# Patient Record
Sex: Female | Born: 1955 | Race: White | Hispanic: No | Marital: Married | Smoking: Never smoker
Health system: Southern US, Community
[De-identification: ages and names within clinical notes are randomized; demographics above are authoritative.]

## PROBLEM LIST (undated history)

## (undated) DIAGNOSIS — M25559 Pain in unspecified hip: Secondary | ICD-10-CM

## (undated) DIAGNOSIS — M810 Age-related osteoporosis without current pathological fracture: Secondary | ICD-10-CM

## (undated) DIAGNOSIS — A6920 Lyme disease, unspecified: Secondary | ICD-10-CM

## (undated) DIAGNOSIS — E039 Hypothyroidism, unspecified: Secondary | ICD-10-CM

## (undated) DIAGNOSIS — F419 Anxiety disorder, unspecified: Secondary | ICD-10-CM

## (undated) DIAGNOSIS — R519 Headache, unspecified: Secondary | ICD-10-CM

## (undated) DIAGNOSIS — F32A Depression, unspecified: Secondary | ICD-10-CM

## (undated) DIAGNOSIS — C801 Malignant (primary) neoplasm, unspecified: Secondary | ICD-10-CM

## (undated) DIAGNOSIS — E785 Hyperlipidemia, unspecified: Secondary | ICD-10-CM

## (undated) DIAGNOSIS — N9489 Other specified conditions associated with female genital organs and menstrual cycle: Secondary | ICD-10-CM

## (undated) DIAGNOSIS — F09 Unspecified mental disorder due to known physiological condition: Secondary | ICD-10-CM

## (undated) DIAGNOSIS — M1990 Unspecified osteoarthritis, unspecified site: Secondary | ICD-10-CM

## (undated) DIAGNOSIS — G250 Essential tremor: Secondary | ICD-10-CM

## (undated) DIAGNOSIS — N83291 Other ovarian cyst, right side: Secondary | ICD-10-CM

## (undated) HISTORY — PX: EYE SURGERY: SHX253

## (undated) HISTORY — PX: COLONOSCOPY: SHX174

## (undated) HISTORY — PX: CARDIAC CATHETERIZATION: SHX172

---

## 2008-07-30 ENCOUNTER — Ambulatory Visit: Payer: Self-pay | Admitting: Family Medicine

## 2010-01-08 ENCOUNTER — Ambulatory Visit: Payer: Self-pay

## 2018-09-05 ENCOUNTER — Other Ambulatory Visit: Payer: Self-pay | Admitting: Licensed Practical Nurse

## 2018-09-05 DIAGNOSIS — R1909 Other intra-abdominal and pelvic swelling, mass and lump: Secondary | ICD-10-CM

## 2021-04-29 ENCOUNTER — Other Ambulatory Visit: Payer: Self-pay | Admitting: Internal Medicine

## 2021-04-29 DIAGNOSIS — Z1231 Encounter for screening mammogram for malignant neoplasm of breast: Secondary | ICD-10-CM

## 2021-10-30 ENCOUNTER — Other Ambulatory Visit: Payer: Self-pay | Admitting: Internal Medicine

## 2021-10-30 DIAGNOSIS — Z1231 Encounter for screening mammogram for malignant neoplasm of breast: Secondary | ICD-10-CM

## 2022-01-01 ENCOUNTER — Other Ambulatory Visit: Payer: Self-pay

## 2022-01-01 ENCOUNTER — Ambulatory Visit
Admission: RE | Admit: 2022-01-01 | Discharge: 2022-01-01 | Disposition: A | Discharge status: Home / Self Care | Payer: Medicare Other | Source: Ambulatory Visit | Attending: Internal Medicine | Admitting: Internal Medicine

## 2022-01-01 DIAGNOSIS — Z1231 Encounter for screening mammogram for malignant neoplasm of breast: Secondary | ICD-10-CM | POA: Insufficient documentation

## 2022-01-01 HISTORY — DX: Malignant (primary) neoplasm, unspecified: C80.1

## 2022-01-15 ENCOUNTER — Other Ambulatory Visit: Payer: Self-pay | Admitting: *Deleted

## 2022-01-15 ENCOUNTER — Inpatient Hospital Stay
Admission: RE | Admit: 2022-01-15 | Discharge: 2022-01-15 | Disposition: A | Discharge status: Home / Self Care | Payer: Self-pay | Source: Ambulatory Visit | Attending: *Deleted | Admitting: *Deleted

## 2022-01-15 DIAGNOSIS — Z1231 Encounter for screening mammogram for malignant neoplasm of breast: Secondary | ICD-10-CM

## 2022-09-10 ENCOUNTER — Other Ambulatory Visit: Payer: Self-pay | Admitting: Internal Medicine

## 2022-09-10 DIAGNOSIS — E039 Hypothyroidism, unspecified: Secondary | ICD-10-CM

## 2022-09-10 DIAGNOSIS — N83201 Unspecified ovarian cyst, right side: Secondary | ICD-10-CM

## 2022-09-15 ENCOUNTER — Ambulatory Visit
Admission: RE | Admit: 2022-09-15 | Discharge: 2022-09-15 | Disposition: A | Discharge status: Home / Self Care | Payer: Medicare Other | Source: Ambulatory Visit | Attending: Internal Medicine | Admitting: Internal Medicine

## 2022-09-15 ENCOUNTER — Other Ambulatory Visit: Payer: Self-pay | Admitting: Internal Medicine

## 2022-09-15 DIAGNOSIS — N83201 Unspecified ovarian cyst, right side: Secondary | ICD-10-CM

## 2022-09-15 DIAGNOSIS — E039 Hypothyroidism, unspecified: Secondary | ICD-10-CM

## 2022-11-16 ENCOUNTER — Other Ambulatory Visit: Payer: Self-pay | Admitting: Obstetrics and Gynecology

## 2022-12-03 ENCOUNTER — Encounter
Admission: RE | Admit: 2022-12-03 | Discharge: 2022-12-03 | Disposition: A | Discharge status: Home / Self Care | Payer: Medicare Other | Source: Ambulatory Visit | Attending: Obstetrics and Gynecology | Admitting: Obstetrics and Gynecology

## 2022-12-03 ENCOUNTER — Other Ambulatory Visit: Payer: Self-pay

## 2022-12-03 DIAGNOSIS — Z01812 Encounter for preprocedural laboratory examination: Secondary | ICD-10-CM

## 2022-12-03 HISTORY — DX: Hyperlipidemia, unspecified: E78.5

## 2022-12-03 HISTORY — DX: Unspecified mental disorder due to known physiological condition: F09

## 2022-12-03 HISTORY — DX: Hypothyroidism, unspecified: E03.9

## 2022-12-03 HISTORY — DX: Age-related osteoporosis without current pathological fracture: M81.0

## 2022-12-03 HISTORY — DX: Depression, unspecified: F32.A

## 2022-12-03 HISTORY — DX: Pain in unspecified hip: M25.559

## 2022-12-03 HISTORY — DX: Headache, unspecified: R51.9

## 2022-12-03 HISTORY — DX: Unspecified osteoarthritis, unspecified site: M19.90

## 2022-12-03 HISTORY — DX: Anxiety disorder, unspecified: F41.9

## 2022-12-03 HISTORY — DX: Essential tremor: G25.0

## 2022-12-03 HISTORY — DX: Other ovarian cyst, right side: N83.291

## 2022-12-03 HISTORY — DX: Other specified conditions associated with female genital organs and menstrual cycle: N94.89

## 2022-12-03 HISTORY — DX: Lyme disease, unspecified: A69.20

## 2022-12-03 NOTE — Patient Instructions (Addendum)
Your procedure is scheduled on: 12/11/22 - Friday Report to the Registration Desk on the 1st floor of the [facility]. To find out your arrival time, please call [phone] between 1PM - 3PM on: 12/10/22 - Thursday If your arrival time is 6:00 am, do not arrive prior to that time as the [facility] entrance doors do not open until 6:00 am.  REMEMBER: Instructions that are not followed completely may result in serious medical risk, up to and including death; or upon the discretion of your surgeon and anesthesiologist your surgery may need to be rescheduled.  Do not eat food or drink any liquids after midnight the night before surgery.  No gum chewing, lozengers or hard candies.  You may however, drink CLEAR liquids up to 2 hours before you are scheduled to arrive for your surgery. Do not drink anything within 2 hours of your scheduled arrival time.  Clear liquids include: - water  - apple juice without pulp - gatorade (not RED colors) - black coffee or tea (Do NOT add milk or creamers to the coffee or tea) Do NOT drink anything that is not on this list.  TAKE THESE MEDICATIONS THE MORNING OF SURGERY WITH A SIP OF WATER: - levothyroxine (SYNTHROID)  - iothyronine (CYTOMEL   One week prior to surgery: Stop Anti-inflammatories (NSAIDS) such as Advil, Aleve, Ibuprofen, Motrin, Naproxen, Naprosyn and Aspirin based products such as Excedrin, Goodys Powder, BC Powder.  Stop ANY OVER THE COUNTER supplements until after surgery.  You may take Tylenol if needed for pain up until the day of surgery.  No Alcohol for 24 hours before or after surgery.  No Smoking including e-cigarettes for 24 hours prior to surgery.  No chewable tobacco products for at least 6 hours prior to surgery.  No nicotine patches on the day of surgery.  Do not use any "recreational" drugs for at least a week prior to your surgery.  Please be advised that the combination of cocaine and anesthesia may have negative  outcomes, up to and including death. If you test positive for cocaine, your surgery will be cancelled.  On the morning of surgery brush your teeth with toothpaste and water, you may rinse your mouth with mouthwash if you wish. Do not swallow any toothpaste or mouthwash.  Use CHG Soap or wipes as directed on instruction sheet.  Do not wear jewelry, make-up, hairpins, clips or nail polish.  Do not wear lotions, powders, or perfumes.   Do not shave body from the neck down 48 hours prior to surgery just in case you cut yourself which could leave a site for infection.  Also, freshly shaved skin may become irritated if using the CHG soap.  Contact lenses, hearing aids and dentures may not be worn into surgery.  Do not bring valuables to the hospital. [facility] is not responsible for any missing/lost belongings or valuables.   Notify your doctor if there is any change in your medical condition (cold, fever, infection).  Wear comfortable clothing (specific to your surgery type) to the hospital.  After surgery, you can help prevent lung complications by doing breathing exercises.  Take deep breaths and cough every 1-2 hours. Your doctor may order a device called an Incentive Spirometer to help you take deep breaths. When coughing or sneezing, hold a pillow firmly against your incision with both hands. This is called "splinting." Doing this helps protect your incision. It also decreases belly discomfort.  If you are being admitted to the  hospital overnight, leave your suitcase in the car. After surgery it may be brought to your room.  If you are being discharged the day of surgery, you will not be allowed to drive home. You will need a responsible adult (18 years or older) to drive you home and stay with you that night.   If you are taking public transportation, you will need to have a responsible adult (18 years or older) with you. Please confirm with your physician that it is acceptable  to use public transportation.   Please call the [facility]. at [phone] if you have any questions about these instructions.  Surgery Visitation Policy:  Patients undergoing a surgery or procedure may have two family members or support persons with them as long as the person is not COVID-19 positive or experiencing its symptoms.   Inpatient Visitation:    Visiting hours are 7 a.m. to 8 p.m. Up to four visitors are allowed at one time in a patient room. The visitors may rotate out with other people during the day. One designated support person (adult) may remain overnight.  Due to an increase in RSV and influenza rates and associated hospitalizations, children ages 62 and under will not be able to visit patients in [facility]. Masks continue to be strongly recommended.

## 2022-12-04 ENCOUNTER — Encounter: Payer: Self-pay | Admitting: Urgent Care

## 2022-12-04 ENCOUNTER — Encounter
Admission: RE | Admit: 2022-12-04 | Discharge: 2022-12-04 | Disposition: A | Discharge status: Home / Self Care | Payer: Medicare Other | Source: Ambulatory Visit | Attending: Obstetrics and Gynecology | Admitting: Obstetrics and Gynecology

## 2022-12-04 DIAGNOSIS — Z01812 Encounter for preprocedural laboratory examination: Secondary | ICD-10-CM

## 2022-12-04 DIAGNOSIS — R350 Frequency of micturition: Secondary | ICD-10-CM | POA: Insufficient documentation

## 2022-12-04 DIAGNOSIS — I517 Cardiomegaly: Secondary | ICD-10-CM | POA: Insufficient documentation

## 2022-12-04 DIAGNOSIS — I498 Other specified cardiac arrhythmias: Secondary | ICD-10-CM | POA: Diagnosis not present

## 2022-12-04 DIAGNOSIS — Z01818 Encounter for other preprocedural examination: Secondary | ICD-10-CM | POA: Diagnosis present

## 2022-12-04 LAB — BASIC METABOLIC PANEL
Anion gap: 8 (ref 5–15)
BUN: 14 mg/dL (ref 8–23)
CO2: 26 mmol/L (ref 22–32)
Calcium: 9.4 mg/dL (ref 8.9–10.3)
Chloride: 105 mmol/L (ref 98–111)
Creatinine, Ser: 0.82 mg/dL (ref 0.44–1.00)
GFR, Estimated: >60 mL/min (ref >60)
Glucose, Bld: 101 mg/dL — ABNORMAL HIGH (ref 70–99)
Potassium: 3.5 mmol/L (ref 3.5–5.1)
Sodium: 139 mmol/L (ref 135–145)

## 2022-12-04 LAB — CBC
HCT: 42.2 % (ref 36.0–46.0)
Hemoglobin: 14.1 g/dL (ref 12.0–15.0)
MCH: 32.2 pg (ref 26.0–34.0)
MCHC: 33.4 g/dL (ref 30.0–36.0)
MCV: 96.3 fL (ref 80.0–100.0)
Platelets: 298 10*3/uL (ref 150–400)
RBC: 4.38 MIL/uL (ref 3.87–5.11)
RDW: 12.3 % (ref 11.5–15.5)
WBC: 7.6 10*3/uL (ref 4.0–10.5)
nRBC: 0 % (ref 0.0–0.2)

## 2022-12-04 LAB — URINALYSIS, COMPLETE (UACMP) WITH MICROSCOPIC
Bacteria, UA: NONE SEEN
Bilirubin Urine: NEGATIVE
Glucose, UA: NEGATIVE mg/dL
Hgb urine dipstick: NEGATIVE
Ketones, ur: NEGATIVE mg/dL
Leukocytes,Ua: NEGATIVE
Nitrite: NEGATIVE
Protein, ur: NEGATIVE mg/dL
Specific Gravity, Urine: 1.002 — ABNORMAL LOW (ref 1.005–1.030)
Squamous Epithelial / HPF: NONE SEEN (ref 0–5)
WBC, UA: NONE SEEN WBC/hpf (ref 0–5)
pH: 5 (ref 5.0–8.0)

## 2022-12-04 LAB — TYPE AND SCREEN
ABO/RH(D): O POS
Antibody Screen: NEGATIVE

## 2022-12-07 NOTE — H&P (Signed)
Jacqueline Conway is a 66 y.o. female here for Ovarian Cyst (Ref Dr. Tressia Miners - cyst of ovary, u/s done 08/2022)   Referring provider: Gladstone Lighter, MD   History of Present Illness: Patient new to me, referred for right adnexal mass. TVUS done 09/15/22 for RO cyst previously measured 5.3 cm in 2019 and now 7.3 cm in diameter and solid appearing.     TVUS 08/2022  Uterus Measurements: 6.0 x 3.0 x 3.9 cm = volume: 37 mL. Suboptimally  visualized due to body habitus and inadequate bladder distension. No  gross mass.  Endometrium Thickness: 1 mm.  No endometrial fluid or mass  Right ovary: No normal appearing RIGHT ovary visualized, see below  Left ovary Not visualized, likely obscured by bowel   Other findings: No free pelvic fluid. Visualized urinary bladder  unremarkable. Complex heterogeneous hypoechoic mass in RIGHT adnexa measuring 7.3 x 5.2 x 6.7 cm (volume = 130 cm^3), previously measured 5.3 x 3.2 x 5.0 cm (volume = 44 cm^3). Lesion appears solid and increased in size since the prior study. No definite calcifications, cystic components or evidence of hyperechoic foci to suggest fat.   IMPRESSION:  Unremarkable uterus and endometrial complex with nonvisualization of  ovaries.   7.3 cm diameter solid-appearing RIGHT adnexal mass increased in size since 2019 when it measured 5.4 cm greatest size.   Differential diagnosis includes endometrioma, exophytic uterine  leiomyoma, fibrothecoma, or other neoplasm; due to significant  increase in size since previous exam, surgical evaluation  recommended. If further preoperative characterization is required,  may consider MR imaging with and without contrast.    Component     Latest Ref Rng 09/07/2022 Cancer Antigen (CA) 125 - [facility]     0.0 - 38.1 U/mL 18.7      Today: Patient has had a cyst since 2019, they did bloodwork and ultrasounds to follow this. She was given the option of expectant and surgical management. She changed doctors  and had a gap in imaging until 08/2022.    She can "feel it". Not all the time, but sitting in exam room today, she can feel the cyst in her pelvis. She is also concerned about the risk of torsion.    Pertinent Hx: - SVD x 3  - Last pap 08/2012 neg/neg    - No Fhx of breast, colon or ovarian cancer    - Hx of lyme disease (was bedridden for 4 years and limited mobility following this for many years.)  - Osteoporosis    Past Medical History:  has a past medical history of Anxiety, Cataracts, bilateral, Depression, Essential tremor, Hip joint pain, Hyperlipidemia, Hypothyroidism, Lyme disease, Osteoporosis, and Psychological trauma (about 17 years ago).  Past Surgical History:  has a past surgical history that includes extraction cataract extracapsular w/insertion intraocular prosthesis (Left, 09/21/2022); insj rx eluting implt punctal dilat lac canal ea  (Left, 09/21/2022); extraction cataract extracapsular w/insertion intraocular prosthesis (Right, 10/01/2022); and insj rx eluting implt punctal dilat lac canal ea  (Right, 10/01/2022). Family History: family history includes High blood pressure (Hypertension) in her mother; Lung cancer in her mother; Mental illness in her mother; Stroke in her father. Social History:  reports that she has never smoked. She has never used smokeless tobacco. She reports current alcohol use. She reports that she does not use drugs. OB/GYN History:  OB History       Gravida 3   Para 3   Term 3   Preterm     AB  Living 3       SAB     IAB     Ectopic     Molar     Multiple     Live Births          Allergies: is allergic to penicillins, rifamycin analogues, ampicillin, and rifabutin. Medications: Current Outpatient Medications:    ASHWAGANDHA ROOT EXTRACT ORAL, Take 1 capsule by mouth once daily as needed, Disp: , Rfl:    levothyroxine (SYNTHROID) 75 MCG tablet, TAKE 1 TABLET BY MOUTH EVERY DAY ON EMPTY STOMACH 30-60 MINS  BEFORE BREAKFAST, Disp: 90 tablet, Rfl: 1   liothyronine (CYTOMEL) 25 MCG tablet, Take 0.5 tablets (12.5 mcg total) by mouth once daily Take on an empty stomach with a glass of water at least 30-60 minutes before breakfast. (Patient taking differently: Take 12.5 mcg by mouth every morning Take on an empty stomach with a glass of water at least 30-60 minutes before breakfast.), Disp: 45 tablet, Rfl: 1   MAGNESIUM ORAL, Take 1 tablet by mouth every morning Name: Magnesium Threonate, Disp: , Rfl:    thiamine (VITAMIN B-1) 100 MG tablet, Take 100 mg by mouth as needed, Disp: , Rfl:    vitamin B complex (B COMPLEX 1 ORAL), Take by mouth as needed, Disp: , Rfl:    VITAMIN D3 ORAL, Take 2,000 Units by mouth every morning, Disp: , Rfl:    VITAMIN K2 ORAL, Take 1 capsule by mouth 2 (two) times daily as needed, Disp: , Rfl:    ZINC ORAL, Take 1 tablet by mouth every morning, Disp: , Rfl:    Review of Systems: No SOB, no palpitations or chest pain, no new lower extremity edema, no nausea or vomiting or bowel or bladder complaints. See HPI for gyn specific ROS.    Exam:   BP (!) 140/93   Pulse 101   Ht 167.6 cm ('5\' 6"'$ )   Wt 74.4 kg (164 lb)   BMI 26.47 kg/m     Constitutional:  General appearance: Well nourished, well developed female in no acute distress.  Neuro/psych:  Normal mood and affect. No gross motor deficits. Neck:  Supple, normal appearance.  Respiratory:  Normal respiratory effort, no use of accessory muscles Skin:  No visible rashes or external lesions     Pelvic: deferred    Impression:   The encounter diagnosis was Adnexal mass.   Plan:   - Adnexal Mass, complex, postmenopausal - Discussed with pt her ultrasound results, images not available to me yet, and reviewed her lab results. CA125 wnl per her report in 2019, and normal at 18 - We reviewed that because of the increase in size and solid components described in her cyst, I recommended surgical excision. We discussed  possibility of malignancy, ovarian torsion, dermoid, endometrioma.  - All of her questions answered. - Would plan for bilateral salping oophorectomy with pelvic washings, removing the cyst intact. We reviewed surgical risks and recovery time as well as risk reduction with BSO. Use the robot assist and consider posterior cul de sac

## 2022-12-10 MED ORDER — LACTATED RINGERS IV SOLN: INTRAVENOUS | Status: DC

## 2022-12-10 MED ORDER — ACETAMINOPHEN 500 MG PO TABS: 1000.0000 mg | ORAL_TABLET | ORAL | Status: AC

## 2022-12-10 MED ORDER — FAMOTIDINE 20 MG PO TABS: 20.0000 mg | ORAL_TABLET | Freq: Once | ORAL | Status: AC

## 2022-12-10 MED ORDER — ORAL CARE MOUTH RINSE: 15.0000 mL | Freq: Once | OROMUCOSAL | Status: AC

## 2022-12-10 MED ORDER — GABAPENTIN 300 MG PO CAPS: 300.0000 mg | ORAL_CAPSULE | ORAL | Status: AC

## 2022-12-10 MED ORDER — POVIDONE-IODINE 10 % EX SWAB: 2.0000 | Freq: Once | CUTANEOUS | Status: DC

## 2022-12-10 MED ORDER — CHLORHEXIDINE GLUCONATE 0.12 % MT SOLN: 15.0000 mL | Freq: Once | OROMUCOSAL | Status: AC

## 2022-12-11 ENCOUNTER — Encounter
Admission: RE | Disposition: A | Discharge status: Home / Self Care | Payer: Self-pay | Source: Home / Self Care | Attending: Obstetrics and Gynecology

## 2022-12-11 ENCOUNTER — Encounter: Payer: Self-pay | Admitting: Obstetrics and Gynecology

## 2022-12-11 ENCOUNTER — Other Ambulatory Visit: Payer: Self-pay

## 2022-12-11 ENCOUNTER — Ambulatory Visit: Payer: Medicare Other | Admitting: Anesthesiology

## 2022-12-11 ENCOUNTER — Ambulatory Visit
Admission: RE | Admit: 2022-12-11 | Discharge: 2022-12-11 | Disposition: A | Discharge status: Home / Self Care | Payer: Medicare Other | Attending: Obstetrics and Gynecology | Admitting: Obstetrics and Gynecology

## 2022-12-11 DIAGNOSIS — D270 Benign neoplasm of right ovary: Secondary | ICD-10-CM | POA: Insufficient documentation

## 2022-12-11 DIAGNOSIS — E039 Hypothyroidism, unspecified: Secondary | ICD-10-CM | POA: Insufficient documentation

## 2022-12-11 DIAGNOSIS — N959 Unspecified menopausal and perimenopausal disorder: Secondary | ICD-10-CM | POA: Diagnosis not present

## 2022-12-11 DIAGNOSIS — E785 Hyperlipidemia, unspecified: Secondary | ICD-10-CM | POA: Insufficient documentation

## 2022-12-11 DIAGNOSIS — N839 Noninflammatory disorder of ovary, fallopian tube and broad ligament, unspecified: Secondary | ICD-10-CM | POA: Diagnosis present

## 2022-12-11 DIAGNOSIS — N838 Other noninflammatory disorders of ovary, fallopian tube and broad ligament: Secondary | ICD-10-CM | POA: Insufficient documentation

## 2022-12-11 DIAGNOSIS — G250 Essential tremor: Secondary | ICD-10-CM | POA: Insufficient documentation

## 2022-12-11 DIAGNOSIS — Z01818 Encounter for other preprocedural examination: Secondary | ICD-10-CM

## 2022-12-11 HISTORY — PX: ROBOTIC ASSISTED BILATERAL SALPINGO OOPHERECTOMY: SHX6078

## 2022-12-11 LAB — BASIC METABOLIC PANEL
Anion gap: 9 (ref 5–15)
BUN: 9 mg/dL (ref 8–23)
CO2: 23 mmol/L (ref 22–32)
Calcium: 9 mg/dL (ref 8.9–10.3)
Chloride: 108 mmol/L (ref 98–111)
Creatinine, Ser: 0.55 mg/dL (ref 0.44–1.00)
GFR, Estimated: >60 mL/min (ref >60)
Glucose, Bld: 114 mg/dL — ABNORMAL HIGH (ref 70–99)
Potassium: 3.7 mmol/L (ref 3.5–5.1)
Sodium: 140 mmol/L (ref 135–145)

## 2022-12-11 LAB — ABO/RH: ABO/RH(D): O POS

## 2022-12-11 LAB — CBC
HCT: 41.5 % (ref 36.0–46.0)
Hemoglobin: 13.8 g/dL (ref 12.0–15.0)
MCH: 32.1 pg (ref 26.0–34.0)
MCHC: 33.3 g/dL (ref 30.0–36.0)
MCV: 96.5 fL (ref 80.0–100.0)
Platelets: 263 10*3/uL (ref 150–400)
RBC: 4.3 MIL/uL (ref 3.87–5.11)
RDW: 12.5 % (ref 11.5–15.5)
WBC: 6.9 10*3/uL (ref 4.0–10.5)
nRBC: 0 % (ref 0.0–0.2)

## 2022-12-11 SURGERY — SALPINGO-OOPHORECTOMY, BILATERAL, ROBOT-ASSISTED
Anesthesia: General | Site: Vagina

## 2022-12-11 MED ORDER — ONDANSETRON HCL 4 MG/2ML IJ SOLN
INTRAMUSCULAR | Status: DC | PRN
  Administered 2022-12-11: 4 mg via INTRAVENOUS

## 2022-12-11 MED ORDER — SODIUM CHLORIDE 0.9 % IV SOLN: INTRAVENOUS | Status: DC | PRN

## 2022-12-11 MED ORDER — GABAPENTIN 300 MG PO CAPS
ORAL_CAPSULE | ORAL | Status: AC
  Administered 2022-12-11: 300 mg via ORAL
  Filled 2022-12-11: qty 1

## 2022-12-11 MED ORDER — IBUPROFEN 800 MG PO TABS: 800.0000 mg | ORAL_TABLET | Freq: Three times a day (TID) | ORAL | 1 refills | Status: AC

## 2022-12-11 MED ORDER — SODIUM CHLORIDE 0.9 % IR SOLN
Status: DC | PRN
  Administered 2022-12-11: 200 mL

## 2022-12-11 MED ORDER — MIDAZOLAM HCL 2 MG/2ML IJ SOLN
INTRAMUSCULAR | Status: DC | PRN
  Administered 2022-12-11: 2 mg via INTRAVENOUS

## 2022-12-11 MED ORDER — FENTANYL CITRATE (PF) 100 MCG/2ML IJ SOLN: 25.0000 ug | INTRAMUSCULAR | Status: DC | PRN

## 2022-12-11 MED ORDER — DEXAMETHASONE SODIUM PHOSPHATE 10 MG/ML IJ SOLN
INTRAMUSCULAR | Status: AC
  Filled 2022-12-11: qty 1

## 2022-12-11 MED ORDER — ROCURONIUM BROMIDE 10 MG/ML (PF) SYRINGE
PREFILLED_SYRINGE | INTRAVENOUS | Status: AC
  Filled 2022-12-11: qty 10

## 2022-12-11 MED ORDER — SILVER NITRATE-POT NITRATE 75-25 % EX MISC
CUTANEOUS | Status: AC
  Filled 2022-12-11: qty 10

## 2022-12-11 MED ORDER — OXYCODONE HCL 5 MG PO TABS: 5.0000 mg | ORAL_TABLET | ORAL | 0 refills | Status: AC | PRN

## 2022-12-11 MED ORDER — FAMOTIDINE 20 MG PO TABS
ORAL_TABLET | ORAL | Status: AC
  Administered 2022-12-11: 20 mg via ORAL
  Filled 2022-12-11: qty 1

## 2022-12-11 MED ORDER — PROPOFOL 1000 MG/100ML IV EMUL
INTRAVENOUS | Status: AC
  Filled 2022-12-11: qty 200

## 2022-12-11 MED ORDER — DOCUSATE SODIUM 100 MG PO CAPS: 100.0000 mg | ORAL_CAPSULE | Freq: Two times a day (BID) | ORAL | 0 refills | Status: AC

## 2022-12-11 MED ORDER — PROPOFOL 10 MG/ML IV BOLUS
INTRAVENOUS | Status: DC | PRN
  Administered 2022-12-11: 250 mg via INTRAVENOUS

## 2022-12-11 MED ORDER — DEXAMETHASONE SODIUM PHOSPHATE 10 MG/ML IJ SOLN
INTRAMUSCULAR | Status: DC | PRN
  Administered 2022-12-11: 10 mg via INTRAVENOUS

## 2022-12-11 MED ORDER — CHLORHEXIDINE GLUCONATE 0.12 % MT SOLN
OROMUCOSAL | Status: AC
  Administered 2022-12-11: 15 mL via OROMUCOSAL
  Filled 2022-12-11: qty 15

## 2022-12-11 MED ORDER — BUPIVACAINE HCL (PF) 0.5 % IJ SOLN
INTRAMUSCULAR | Status: AC
  Filled 2022-12-11: qty 30

## 2022-12-11 MED ORDER — FENTANYL CITRATE (PF) 100 MCG/2ML IJ SOLN
INTRAMUSCULAR | Status: AC
  Filled 2022-12-11: qty 2

## 2022-12-11 MED ORDER — ONDANSETRON HCL 4 MG/2ML IJ SOLN
INTRAMUSCULAR | Status: AC
  Filled 2022-12-11: qty 2

## 2022-12-11 MED ORDER — ACETAMINOPHEN 10 MG/ML IV SOLN
INTRAVENOUS | Status: AC
  Filled 2022-12-11: qty 100

## 2022-12-11 MED ORDER — LIDOCAINE HCL (PF) 2 % IJ SOLN
INTRAMUSCULAR | Status: AC
  Filled 2022-12-11: qty 5

## 2022-12-11 MED ORDER — ACETAMINOPHEN 500 MG PO TABS
ORAL_TABLET | ORAL | Status: AC
  Administered 2022-12-11: 1000 mg via ORAL
  Filled 2022-12-11: qty 2

## 2022-12-11 MED ORDER — ONDANSETRON HCL 4 MG/2ML IJ SOLN: 4.0000 mg | Freq: Once | INTRAMUSCULAR | Status: DC | PRN

## 2022-12-11 MED ORDER — LIDOCAINE HCL (CARDIAC) PF 100 MG/5ML IV SOSY
PREFILLED_SYRINGE | INTRAVENOUS | Status: DC | PRN
  Administered 2022-12-11: 100 mg via INTRAVENOUS

## 2022-12-11 MED ORDER — SUGAMMADEX SODIUM 200 MG/2ML IV SOLN
INTRAVENOUS | Status: DC | PRN
  Administered 2022-12-11: 300 mg via INTRAVENOUS

## 2022-12-11 MED ORDER — DEXMEDETOMIDINE HCL IN NACL 80 MCG/20ML IV SOLN
INTRAVENOUS | Status: DC | PRN
  Administered 2022-12-11: 4 ug via BUCCAL

## 2022-12-11 MED ORDER — MIDAZOLAM HCL 2 MG/2ML IJ SOLN
INTRAMUSCULAR | Status: AC
  Filled 2022-12-11: qty 2

## 2022-12-11 MED ORDER — FENTANYL CITRATE (PF) 100 MCG/2ML IJ SOLN
INTRAMUSCULAR | Status: DC | PRN
  Administered 2022-12-11 (×2): 50 ug via INTRAVENOUS

## 2022-12-11 MED ORDER — KETOROLAC TROMETHAMINE 30 MG/ML IJ SOLN
INTRAMUSCULAR | Status: DC | PRN
  Administered 2022-12-11: 30 mg via INTRAVENOUS

## 2022-12-11 MED ORDER — BUPIVACAINE HCL (PF) 0.5 % IJ SOLN
INTRAMUSCULAR | Status: DC | PRN
  Administered 2022-12-11: 30 mL

## 2022-12-11 MED ORDER — KETOROLAC TROMETHAMINE 30 MG/ML IJ SOLN
INTRAMUSCULAR | Status: AC
  Filled 2022-12-11: qty 1

## 2022-12-11 MED ORDER — ROCURONIUM BROMIDE 100 MG/10ML IV SOLN
INTRAVENOUS | Status: DC | PRN
  Administered 2022-12-11: 50 mg via INTRAVENOUS
  Administered 2022-12-11 (×2): 20 mg via INTRAVENOUS

## 2022-12-11 MED ORDER — ACETAMINOPHEN EXTRA STRENGTH 500 MG PO TABS: 1000.0000 mg | ORAL_TABLET | Freq: Four times a day (QID) | ORAL | 0 refills | Status: AC

## 2022-12-11 MED ORDER — EPHEDRINE SULFATE (PRESSORS) 50 MG/ML IJ SOLN
INTRAMUSCULAR | Status: DC | PRN
  Administered 2022-12-11: 10 mg via INTRAVENOUS

## 2022-12-11 MED ORDER — GABAPENTIN 300 MG PO CAPS: 300.0000 mg | ORAL_CAPSULE | Freq: Every day | ORAL | 0 refills | Status: AC

## 2022-12-11 SURGICAL SUPPLY — 77 items
ADH SKN CLS APL DERMABOND .7 (GAUZE/BANDAGES/DRESSINGS) ×2
ANCHOR TIS RET SYS 235ML (MISCELLANEOUS) IMPLANT
APL PRP STRL LF DISP 70% ISPRP (MISCELLANEOUS) ×2
APL SRG 38 LTWT LNG FL B (MISCELLANEOUS)
APPLICATOR ARISTA FLEXITIP XL (MISCELLANEOUS) IMPLANT
BAG DRN RND TRDRP ANRFLXCHMBR (UROLOGICAL SUPPLIES) ×2
BAG LAPAROSCOPIC 12 15 PORT 16 (BASKET) IMPLANT
BAG RETRIEVAL 12/15 (BASKET) ×2
BAG TISS RTRVL C235 10X14 (MISCELLANEOUS) ×2
BAG URINE DRAIN 2000ML AR STRL (UROLOGICAL SUPPLIES) ×2 IMPLANT
BLADE SURG SZ11 CARB STEEL (BLADE) ×2 IMPLANT
CANNULA CAP OBTURATR AIRSEAL 8 (CAP) ×2 IMPLANT
CATH FOLEY 2WAY  5CC 16FR (CATHETERS) ×2
CATH FOLEY 2WAY 5CC 16FR (CATHETERS) ×2
CATH URTH 16FR FL 2W BLN LF (CATHETERS) ×2 IMPLANT
CHLORAPREP W/TINT 26 (MISCELLANEOUS) ×2 IMPLANT
COUNTER NEEDLE 20/40 LG (NEEDLE) ×2 IMPLANT
COVER TIP SHEARS 8 DVNC (MISCELLANEOUS) ×2 IMPLANT
COVER TIP SHEARS 8MM DA VINCI (MISCELLANEOUS) ×2
DERMABOND ADVANCED .7 DNX12 (GAUZE/BANDAGES/DRESSINGS) ×2 IMPLANT
DRAPE 3/4 80X56 (DRAPES) ×4 IMPLANT
DRAPE ARM DVNC X/XI (DISPOSABLE) ×8 IMPLANT
DRAPE COLUMN DVNC XI (DISPOSABLE) ×2 IMPLANT
DRAPE DA VINCI XI ARM (DISPOSABLE) ×8
DRAPE DA VINCI XI COLUMN (DISPOSABLE) ×2
DRAPE ROBOT W/ LEGGING 30X125 (DRAPES) ×2 IMPLANT
ELECT REM PT RETURN 9FT ADLT (ELECTROSURGICAL) ×2
ELECTRODE REM PT RTRN 9FT ADLT (ELECTROSURGICAL) ×2 IMPLANT
GAUZE 4X4 16PLY ~~LOC~~+RFID DBL (SPONGE) ×4 IMPLANT
GLOVE BIO SURGEON STRL SZ7 (GLOVE) ×8 IMPLANT
GLOVE SURG SYN 7.0 (GLOVE) ×2 IMPLANT
GLOVE SURG SYN 7.0 PF PI (GLOVE) ×2 IMPLANT
GLOVE SURG UNDER LTX SZ7.5 (GLOVE) ×8 IMPLANT
GLOVE SURG UNDER POLY LF SZ7.5 (GLOVE) ×2 IMPLANT
GOWN STRL REUS W/ TWL LRG LVL3 (GOWN DISPOSABLE) ×16 IMPLANT
GOWN STRL REUS W/TWL LRG LVL3 (GOWN DISPOSABLE) ×16
GRASPER SUT TROCAR 14GX15 (MISCELLANEOUS) ×2 IMPLANT
HEMOSTAT ARISTA ABSORB 3G PWDR (HEMOSTASIS) IMPLANT
IRRIGATION STRYKERFLOW (MISCELLANEOUS) IMPLANT
IRRIGATOR STRYKERFLOW (MISCELLANEOUS) ×2
IV NS 1000ML (IV SOLUTION)
IV NS 1000ML BAXH (IV SOLUTION) IMPLANT
KIT PINK PAD W/HEAD ARE REST (MISCELLANEOUS) ×2
KIT PINK PAD W/HEAD ARM REST (MISCELLANEOUS) ×2 IMPLANT
KIT TURNOVER CYSTO (KITS) ×2 IMPLANT
LABEL OR SOLS (LABEL) ×2 IMPLANT
MANIFOLD NEPTUNE II (INSTRUMENTS) ×2 IMPLANT
NS IRRIG 1000ML POUR BTL (IV SOLUTION) ×2 IMPLANT
NS IRRIG 500ML POUR BTL (IV SOLUTION) ×2 IMPLANT
OBTURATOR OPTICAL STANDARD 8MM (TROCAR) ×2
OBTURATOR OPTICAL STND 8 DVNC (TROCAR) ×2
OBTURATOR OPTICALSTD 8 DVNC (TROCAR) ×2 IMPLANT
OCCLUDER COLPOPNEUMO (BALLOONS) ×2 IMPLANT
PACK DNC HYST (MISCELLANEOUS) ×2 IMPLANT
PACK GYN LAPAROSCOPIC (MISCELLANEOUS) ×2 IMPLANT
PAD OB MATERNITY 4.3X12.25 (PERSONAL CARE ITEMS) ×2 IMPLANT
PAD PREP 24X41 OB/GYN DISP (PERSONAL CARE ITEMS) ×2 IMPLANT
RETRACTOR RING XSMALL (MISCELLANEOUS) IMPLANT
RTRCTR WOUND ALEXIS 13CM XS SH (MISCELLANEOUS) ×2
SCRUB CHG 4% DYNA-HEX 4OZ (MISCELLANEOUS) ×2 IMPLANT
SEAL CANN UNIV 5-8 DVNC XI (MISCELLANEOUS) ×6 IMPLANT
SEAL XI 5MM-8MM UNIVERSAL (MISCELLANEOUS) ×6
SEALER VESSEL DA VINCI XI (MISCELLANEOUS) ×2
SEALER VESSEL EXT DVNC XI (MISCELLANEOUS) ×2 IMPLANT
SET TUBE FILTERED XL AIRSEAL (SET/KITS/TRAYS/PACK) ×2 IMPLANT
SOL PREP PVP 2OZ (MISCELLANEOUS) ×2
SOLUTION ELECTROLUBE (MISCELLANEOUS) ×2 IMPLANT
SOLUTION PREP PVP 2OZ (MISCELLANEOUS) ×2 IMPLANT
SURGILUBE 2OZ TUBE FLIPTOP (MISCELLANEOUS) ×2 IMPLANT
SUT MNCRL 4-0 (SUTURE) ×2
SUT MNCRL 4-0 27XMFL (SUTURE) ×2
SUT VIC AB 0 CT2 27 (SUTURE) ×4 IMPLANT
SUT VICRYL 0 AB UR-6 (SUTURE) IMPLANT
SUT VLOC 90 2/L VL 12 GS22 (SUTURE) ×2 IMPLANT
SUTURE MNCRL 4-0 27XMF (SUTURE) ×2 IMPLANT
TOWEL OR 17X26 4PK STRL BLUE (TOWEL DISPOSABLE) ×2 IMPLANT
TRAP SPECIMEN MUCUS 40CC (MISCELLANEOUS) IMPLANT

## 2022-12-11 NOTE — Discharge Instructions (Addendum)
Laparoscopic Ovarian Surgery Discharge Instructions  For the next three days, take ibuprofen and acetaminophen on a schedule, every 8 hours. You can take them together or you can intersperse them, and take one every four hours. I also gave you gabapentin for nighttime, to help you sleep and also to control pain. Take gabapentin medicines at night for at least the next 3 nights. You also have a narcotic, oxycodone, to take as needed if the above medicines don't help.  Postop constipation is a major cause of pain. Stay well hydrated, walk as you tolerate, and take over the counter senna as well as stool softeners if you need them.   RISKS AND COMPLICATIONS  Infection. Bleeding. Injury to surrounding organs. Anesthetic side effects.   PROCEDURE  You may be given a medicine to help you relax (sedative) before the procedure. You will be given a medicine to make you sleep (general anesthetic) during the procedure. A tube will be put down your throat to help your breath while under general anesthesia. Several small cuts (incisions) are made in the lower abdominal area and one incision is made near the belly button. Your abdominal area will be inflated with a safe gas (carbon dioxide). This helps give the surgeon room to operate, visualize, and helps the surgeon avoid other organs. A thin, lighted tube (laparoscope) with a camera attached is inserted into your abdomen through the incision near the belly button. Other small instruments may also be inserted through other abdominal incisions. The ovary is located and are removed. After the ovary is removed, the gas is released from the abdomen. The incisions will be closed with stitches (sutures), and Dermabond. A bandage may be placed over the incisions.  AFTER THE PROCEDURE  You will also have some mild abdominal discomfort for 3-7 days. You will be given pain medicine to ease any discomfort. As long as there are no problems, you may be allowed to  go home. Someone will need to drive you home and be with you for at least 24 hours once home. You may have some mild discomfort in the throat. This is from the tube placed in your throat while you were sleeping. You may experience discomfort in the shoulder area from some trapped air between the liver and diaphragm. This sensation is normal and will slowly go away on its own.  HOME CARE INSTRUCTIONS  Take all medicines as directed. Only take over-the-counter or prescription medicines for pain, discomfort, or fever as directed by your caregiver. Resume daily activities as directed. Showers are preferred over baths for 2 weeks. You may resume sexual activities in 1 week or as you feel you would like to. Do not drive while taking narcotics.  SEEK MEDICAL CARE IF: . There is increasing abdominal pain. You feel lightheaded or faint. You have the chills. You have an oral temperature above 102 F (38.9 C). There is pus-like (purulent) drainage from any of the wounds. You are unable to pass gas or have a bowel movement. You feel sick to your stomach (nauseous) or throw up (vomit) and can't control it with your medicines.  MAKE SURE YOU:  Understand these instructions. Will watch your condition. Will get help right away if you are not doing well or get worse.  ExitCare Patient Information 2013 [place].    Laparoscopic Ovarian Surgery Discharge Instructions  For the next three days, take ibuprofen and acetaminophen on a schedule, every 8 hours. You can take them together or you can intersperse them,  and take one every four hours. I also gave you gabapentin for nighttime, to help you sleep and also to control pain. Take gabapentin medicines at night for at least the next 3 nights. You also have a narcotic, oxycodone, to take as needed if the above medicines don't help.  Postop constipation is a major cause of pain. Stay well hydrated, walk as you tolerate, and take over the counter  senna as well as stool softeners if you need them.   RISKS AND COMPLICATIONS  Infection. Bleeding. Injury to surrounding organs. Anesthetic side effects.   PROCEDURE  You may be given a medicine to help you relax (sedative) before the procedure. You will be given a medicine to make you sleep (general anesthetic) during the procedure. A tube will be put down your throat to help your breath while under general anesthesia. Several small cuts (incisions) are made in the lower abdominal area and one incision is made near the belly button. Your abdominal area will be inflated with a safe gas (carbon dioxide). This helps give the surgeon room to operate, visualize, and helps the surgeon avoid other organs. A thin, lighted tube (laparoscope) with a camera attached is inserted into your abdomen through the incision near the belly button. Other small instruments may also be inserted through other abdominal incisions. The ovary is located and are removed. After the ovary is removed, the gas is released from the abdomen. The incisions will be closed with stitches (sutures), and Dermabond. A bandage may be placed over the incisions.  AFTER THE PROCEDURE  You will also have some mild abdominal discomfort for 3-7 days. You will be given pain medicine to ease any discomfort. As long as there are no problems, you may be allowed to go home. Someone will need to drive you home and be with you for at least 24 hours once home. You may have some mild discomfort in the throat. This is from the tube placed in your throat while you were sleeping. You may experience discomfort in the shoulder area from some trapped air between the liver and diaphragm. This sensation is normal and will slowly go away on its own.  HOME CARE INSTRUCTIONS  Take all medicines as directed. Only take over-the-counter or prescription medicines for pain, discomfort, or fever as directed by your caregiver. Resume daily activities as  directed. Showers are preferred over baths for 2 weeks. You may resume sexual activities in 1 week or as you feel you would like to. Do not drive while taking narcotics.  SEEK MEDICAL CARE IF: . There is increasing abdominal pain. You feel lightheaded or faint. You have the chills. You have an oral temperature above 102 F (38.9 C). There is pus-like (purulent) drainage from any of the wounds. You are unable to pass gas or have a bowel movement. You feel sick to your stomach (nauseous) or throw up (vomit) and can't control it with your medicines.  MAKE SURE YOU:  Understand these instructions. Will watch your condition. Will get help right away if you are not doing well or get worse.  ExitCare Patient Information 2013 [place].  Here is a helpful article from the website [employer], regarding constipation  Here are reasons why constipation occurs after surgery: 1) During the operation and in the recovery room, most people are given opioid pain medication, primarily through an IV, to treat moderate or severe pain. Intravenous opioids include morphine, Dilaudid and fentanyl. After surgery, patients are often prescribed opioid pain medication to  take by mouth at home, including codeine, Vicodin, Norco, and Percocet. All of these medications cause constipation by slowing down the movement of your intestine. 2) Changes in your diet before surgery can be another culprit. It is common to get specific instructions to change how you normally eat or drink before your surgery, like only having liquids the day before or not having anything to eat or drink after midnight the night before surgery. For this reason, temporary dehydration may occur. This, along with not eating or only having liquids, means that you are getting less fiber than usual. Both these factors contribute to constipation. 3) Changes in your diet after surgery can also contribute to the problem. Although many people don't  have dietary restrictions after operations, being under anesthesia can make you lose your appetite for several hours and maybe even days. Some people can even have nausea or vomiting. Not eating or drinking normally means that you are not getting enough fiber and you can get dehydrated, both leading to constipation. 4) Lying in a bed more than usual--which happens before, during and after surgery--combined with the medications and diet changes, all work together to slow down your colon and make your poop turn to rock.  No one likes to be constipated.  Let's face it, it's not a pleasant feeling when you don't poop for days, then strain on the toilet to finally pass something large enough to cause damage. An ounce of prevention is worth a pound of cure, so: Assume you will be constipated. Plan and prepare accordingly. Post-surgery is one of those unique situations where the temporary use of laxatives can make a world of difference. Always consult with your doctor, and recognize that if you wait several days after surgery to take a laxative, the constipation might be too severe for these over-the-counter options. It is always important to discuss all medications you plan on taking with your doctor. Ask your doctor if you can start the laxative immediately after surgery. *  Here are go-to post-surgery laxatives: Senna: Senna is an herb that acts as a "stimulant laxative," meaning it increases the activity of the intestine to cause you to have a bowel movement. It comes in many forms, but senna pills are easy to take and are sold over the counter at almost all pharmacies. Since opioid pain medications slow down the activity of the intestine, it makes sense to take a medication to help reverse that side effect. Long-term use of a stimulant laxative is not a good idea since it can make your colon "lazy" and not function properly; however, temporary use immediately after surgery is acceptable. In general, if you  are able to eat a normal diet, taking senna soon after surgery works the best. Senna usually works within hours to produce a bowel movement, but this is less predictable when you are taking different medications after surgery. Try not to wait several days to start taking senna, as often it is too late by then. Just like with all medications or supplements, check with your doctor before starting new treatment.   Magnesium: Magnesium is an important mineral that our body needs. We get magnesium from some foods that we eat, especially foods that are high in fiber such as broccoli, almonds and whole grains. There are also magnesium-based medications used to treat constipation including milk of magnesia (magnesium hydroxide), magnesium citrate and magnesium oxide. They work by drawing water into the intestine, putting it into the class of "osmotic" laxatives. Magnesium products  in low doses appear to be safe, but if taken in very large doses, can lead to problems such as irregular heartbeat, low blood pressure and even death. It can also affect other medications you might be taking, therefore it is important to discuss using magnesium with your physician and pharmacist before initiating therapy. Most over-the-counter magnesium laxatives work very well to help with the constipation related to surgery, but sometimes they work too well and lead to diarrhea. Make sure you are somewhere with easy access to a bathroom, just in case.   Bisacodyl: Bisacodyl (generic name) is sold under brand names such as Dulcolax. Much like senna, it is a "stimulant laxative," meaning it makes your intestines move more quickly to push out the stool. This is another good choice to start taking as soon as your doctor says you can take a laxative after surgery. It comes in pill form and as a suppository, which is a good choice for people who cannot or are not allowed to swallow pills. Studies have shown that it works as a laxative, but like  most of these medications, you should use this on a short-term basis only.   Enema: Enemas strike fear in many people, but FEAR NOT! It's nowhere near as big a deal as you may think. An enema is just a way to get some liquid into your rectum by placing a specially designed device through your anus. If you have never done one, it might seem like a painful, unpleasant, uncomfortable, complicated and lengthy procedure. But in reality, it's simple, takes just a few seconds and is highly effective. The small ready-made bottles you buy at the pharmacy are much easier than the hose/large rubber container type. Those recommended positions illustrated in some instructions are generally not necessary to place the enema. It's very similar to the insertion of a tampon, requiring a slight squat. Some extra lubrication on the enema's tip (or on your anus) will make it a breeze. In certain cases, there is no substitute for a good enema. For example, if someone has not pooped for a few days, the beginning of the poop waiting to come out can become rock hard. Passing that hard stool can lead to much pain and problems like anal fissures. Inserting a little liquid to break up the rock-hard stool will help make its passage much easier. Enemas come with different liquids. Most come with saline, but there are also mineral oil options. You can also use warm water in the reusable enema containers. They all work. But since saline can sometimes be irritating, so try a mineral oil or water enema instead.  Here are commonly recommended constipation medications that do not work well for post-surgery constipation: Docusate: Docusate (generic name) most commonly referred to as Colace (brand name) is not really a laxative, but is classified as a stool softener. Although this medication is commonly prescribed, it is not recommended for several reasons: 1) there is no good medical evidence that it works 2) even if it has an effect, which is  very questionable, it is minimal and cannot combat the intestinal slowing caused by the opioid medications. Skip docusate to save money and space in your pillbox for something more effective.  PEG: Miralax (brand name) is basically a chemical called polyethylene glycol (PEG) and it has gained tremendous popularity as a laxative. This product is an "osmotic laxative" meaning it works by pulling water into the stool, making it softer. This is very similar to the action of  natural fiber in foods and supplements. Therefore, the effect seen by this medication is not immediate, causing a bowel movement in a day or more. Is this medication strong enough to battle the constipation related to having an operation? Maybe for some people not prone to constipation. But for most people, other laxatives are better to prevent constipation after surgery.Here is a helpful article from the website [employer], regarding constipation  Here are reasons why constipation occurs after surgery: 1) During the operation and in the recovery room, most people are given opioid pain medication, primarily through an IV, to treat moderate or severe pain. Intravenous opioids include morphine, Dilaudid and fentanyl. After surgery, patients are often prescribed opioid pain medication to take by mouth at home, including codeine, Vicodin, Norco, and Percocet. All of these medications cause constipation by slowing down the movement of your intestine. 2) Changes in your diet before surgery can be another culprit. It is common to get specific instructions to change how you normally eat or drink before your surgery, like only having liquids the day before or not having anything to eat or drink after midnight the night before surgery. For this reason, temporary dehydration may occur. This, along with not eating or only having liquids, means that you are getting less fiber than usual. Both these factors contribute to constipation. 3) Changes in your  diet after surgery can also contribute to the problem. Although many people don't have dietary restrictions after operations, being under anesthesia can make you lose your appetite for several hours and maybe even days. Some people can even have nausea or vomiting. Not eating or drinking normally means that you are not getting enough fiber and you can get dehydrated, both leading to constipation. 4) Lying in a bed more than usual--which happens before, during and after surgery--combined with the medications and diet changes, all work together to slow down your colon and make your poop turn to rock.  No one likes to be constipated.  Let's face it, it's not a pleasant feeling when you don't poop for days, then strain on the toilet to finally pass something large enough to cause damage. An ounce of prevention is worth a pound of cure, so: Assume you will be constipated. Plan and prepare accordingly. Post-surgery is one of those unique situations where the temporary use of laxatives can make a world of difference. Always consult with your doctor, and recognize that if you wait several days after surgery to take a laxative, the constipation might be too severe for these over-the-counter options. It is always important to discuss all medications you plan on taking with your doctor. Ask your doctor if you can start the laxative immediately after surgery. *  Here are go-to post-surgery laxatives: Senna: Senna is an herb that acts as a "stimulant laxative," meaning it increases the activity of the intestine to cause you to have a bowel movement. It comes in many forms, but senna pills are easy to take and are sold over the counter at almost all pharmacies. Since opioid pain medications slow down the activity of the intestine, it makes sense to take a medication to help reverse that side effect. Long-term use of a stimulant laxative is not a good idea since it can make your colon "lazy" and not function properly;  however, temporary use immediately after surgery is acceptable. In general, if you are able to eat a normal diet, taking senna soon after surgery works the best. Senna usually works within hours to produce  a bowel movement, but this is less predictable when you are taking different medications after surgery. Try not to wait several days to start taking senna, as often it is too late by then. Just like with all medications or supplements, check with your doctor before starting new treatment.   Magnesium: Magnesium is an important mineral that our body needs. We get magnesium from some foods that we eat, especially foods that are high in fiber such as broccoli, almonds and whole grains. There are also magnesium-based medications used to treat constipation including milk of magnesia (magnesium hydroxide), magnesium citrate and magnesium oxide. They work by drawing water into the intestine, putting it into the class of "osmotic" laxatives. Magnesium products in low doses appear to be safe, but if taken in very large doses, can lead to problems such as irregular heartbeat, low blood pressure and even death. It can also affect other medications you might be taking, therefore it is important to discuss using magnesium with your physician and pharmacist before initiating therapy. Most over-the-counter magnesium laxatives work very well to help with the constipation related to surgery, but sometimes they work too well and lead to diarrhea. Make sure you are somewhere with easy access to a bathroom, just in case.   Bisacodyl: Bisacodyl (generic name) is sold under brand names such as Dulcolax. Much like senna, it is a "stimulant laxative," meaning it makes your intestines move more quickly to push out the stool. This is another good choice to start taking as soon as your doctor says you can take a laxative after surgery. It comes in pill form and as a suppository, which is a good choice for people who cannot or are not  allowed to swallow pills. Studies have shown that it works as a laxative, but like most of these medications, you should use this on a short-term basis only.   Enema: Enemas strike fear in many people, but FEAR NOT! It's nowhere near as big a deal as you may think. An enema is just a way to get some liquid into your rectum by placing a specially designed device through your anus. If you have never done one, it might seem like a painful, unpleasant, uncomfortable, complicated and lengthy procedure. But in reality, it's simple, takes just a few seconds and is highly effective. The small ready-made bottles you buy at the pharmacy are much easier than the hose/large rubber container type. Those recommended positions illustrated in some instructions are generally not necessary to place the enema. It's very similar to the insertion of a tampon, requiring a slight squat. Some extra lubrication on the enema's tip (or on your anus) will make it a breeze. In certain cases, there is no substitute for a good enema. For example, if someone has not pooped for a few days, the beginning of the poop waiting to come out can become rock hard. Passing that hard stool can lead to much pain and problems like anal fissures. Inserting a little liquid to break up the rock-hard stool will help make its passage much easier. Enemas come with different liquids. Most come with saline, but there are also mineral oil options. You can also use warm water in the reusable enema containers. They all work. But since saline can sometimes be irritating, so try a mineral oil or water enema instead.  Here are commonly recommended constipation medications that do not work well for post-surgery constipation: Docusate: Docusate (generic name) most commonly referred to as Colace (brand name) is  not really a laxative, but is classified as a stool softener. Although this medication is commonly prescribed, it is not recommended for several reasons: 1) there  is no good medical evidence that it works 2) even if it has an effect, which is very questionable, it is minimal and cannot combat the intestinal slowing caused by the opioid medications. Skip docusate to save money and space in your pillbox for something more effective.  PEG: Miralax (brand name) is basically a chemical called polyethylene glycol (PEG) and it has gained tremendous popularity as a laxative. This product is an "osmotic laxative" meaning it works by pulling water into the stool, making it softer. This is very similar to the action of natural fiber in foods and supplements. Therefore, the effect seen by this medication is not immediate, causing a bowel movement in a day or more. Is this medication strong enough to battle the constipation related to having an operation? Maybe for some people not prone to constipation. But for most people, other laxatives are better to prevent constipation after surgery.    AMBULATORY SURGERY  DISCHARGE INSTRUCTIONS   The drugs that you were given will stay in your system until tomorrow so for the next 24 hours you should not:  Drive an automobile Make any legal decisions Drink any alcoholic beverage   You may resume regular meals tomorrow.  Today it is better to start with liquids and gradually work up to solid foods.  You may eat anything you prefer, but it is better to start with liquids, then soup and crackers, and gradually work up to solid foods.   Please notify your doctor immediately if you have any unusual bleeding, trouble breathing, redness and pain at the surgery site, drainage, fever, or pain not relieved by medication.    Additional Instructions:   Please contact your physician with any problems or Same Day Surgery at [phone], Monday through Friday 6 am to 4 pm, or [place] at [facility] number at [phone].

## 2022-12-11 NOTE — Interval H&P Note (Signed)
History and Physical Interval Note:  12/11/2022 10:39 AM  Jacqueline Conway Reasons  has presented today for surgery, with the diagnosis of complex right ovarian cyst.  The various methods of treatment have been discussed with the patient and family. After consideration of risks, benefits and other options for treatment, the patient has consented to  Procedure(s): XI ROBOTIC ASSISTED BILATERAL SALPINGO OOPHORECTOMY, PELVIC WASHINGS (Bilateral) EXAM UNDER ANESTHESIA (N/A) as a surgical intervention.  The patient's history has been reviewed, patient examined, no change in status, stable for surgery.  I have reviewed the patient's chart and labs.  Questions were answered to the patient's satisfaction.     [name]

## 2022-12-11 NOTE — Anesthesia Preprocedure Evaluation (Addendum)
Anesthesia Evaluation  Patient identified by MRN, date of birth, ID band Patient awake    Reviewed: Allergy & Precautions, NPO status , Patient's Chart, lab work & pertinent test results  Airway Mallampati: II  TM Distance: >3 FB Neck ROM: full    Dental  (+) Teeth Intact   Pulmonary neg pulmonary ROS   Pulmonary exam normal breath sounds clear to auscultation       Cardiovascular Exercise Tolerance: Good negative cardio ROS Normal cardiovascular exam Rhythm:Regular Rate:Normal     Neuro/Psych   Anxiety Depression    negative neurological ROS  negative psych ROS   GI/Hepatic negative GI ROS, Neg liver ROS,,,  Endo/Other  negative endocrine ROSHypothyroidism    Renal/GU negative Renal ROS     Musculoskeletal  (+) Arthritis ,    Abdominal Normal abdominal exam  (+)   Peds negative pediatric ROS (+)  Hematology negative hematology ROS (+)   Anesthesia Other Findings Past Medical History: No date: Adnexal mass No date: Anxiety No date: Arthritis     Comment:  back- DDD No date: Cancer ([place])     Comment:  basal cell skin ca No date: Complex cyst of right ovary No date: Depression No date: Essential tremor No date: Headache     Comment:  migrains No date: Hip joint pain No date: Hyperlipidemia No date: Hypothyroidism No date: Lyme disease No date: Osteoporosis No date: Psychological trauma  Past Surgical History: No date: CARDIAC CATHETERIZATION No date: COLONOSCOPY No date: EYE SURGERY     Comment:  bilateral cataract  BMI    Body Mass Index: 26.15 kg/m      Reproductive/Obstetrics negative OB ROS                             Anesthesia Physical Anesthesia Plan  ASA: 2  Anesthesia Plan: General   Post-op Pain Management:    Induction: Intravenous  PONV Risk Score and Plan: Ondansetron, Dexamethasone, Midazolam and Treatment may vary due to age or medical  condition  Airway Management Planned: Oral ETT  Additional Equipment:   Intra-op Plan:   Post-operative Plan: Extubation in OR  Informed Consent: I have reviewed the patients History and Physical, chart, labs and discussed the procedure including the risks, benefits and alternatives for the proposed anesthesia with the patient or authorized representative who has indicated his/her understanding and acceptance.     Dental Advisory Given  Plan Discussed with: CRNA and Surgeon  Anesthesia Plan Comments:        Anesthesia Quick Evaluation

## 2022-12-11 NOTE — Anesthesia Procedure Notes (Signed)
Procedure Name: Intubation Date/Time: 12/11/2022 11:38 AM  Performed by: Candice Camp, CRNAPre-anesthesia Checklist: Timeout performed, Patient being monitored, Suction available, Emergency Drugs available and Patient identified Patient Re-evaluated:Patient Re-evaluated prior to induction Oxygen Delivery Method: Circle system utilized Preoxygenation: Pre-oxygenation with 100% oxygen Induction Type: IV induction Ventilation: Mask ventilation without difficulty Laryngoscope Size: Miller and 2 Grade View: Grade I Tube type: Oral Tube size: 6.5 mm Number of attempts: 1 Airway Equipment and Method: Stylet Placement Confirmation: ETT inserted through vocal cords under direct vision, positive ETCO2 and breath sounds checked- equal and bilateral Secured at: 22 cm Tube secured with: Tape Dental Injury: Teeth and Oropharynx as per pre-operative assessment

## 2022-12-11 NOTE — Op Note (Signed)
Jacqueline Conway PROCEDURE DATE: 12/11/2022  PREOPERATIVE DIAGNOSIS: complex right adnexal mass POSTOPERATIVE DIAGNOSIS: The same PROCEDURE:  XI ROBOTIC ASSISTED BILATERAL SALPINGO OOPHORECTOMY, PELVIC WASHINGS: 62130 (CPT) EXAM UNDER ANESTHESIA: [id number]  SURGEON:  Dr. [name], MD ASSISTANT: CST Ria Clock Anesthesiologist:  Anesthesiologist: [place] [facility], Jane Canary, MD CRNA: Lerry Liner, CRNA; Candice Camp, CRNA  INDICATIONS: 66 y.o. F here for definitive surgical management secondary to the indications listed under preoperative diagnoses; please see preoperative note for further details.   Complex adnexal mass, large  Risks of surgery were discussed with the patient including but not limited to: bleeding which may require transfusion or reoperation; infection which may require antibiotics; injury to bowel, bladder, ureters or other surrounding organs; need for additional procedures; thromboembolic phenomenon, incisional problems and other postoperative/anesthesia complications. Written informed consent was obtained.    FINDINGS:   Pelvic: External genitalia negative for lesions. Vagina negative. Adnexa negative for masses or nodularity. Cervix without gross lesions. Uterus mobile, anteverted, small.   Intraoperative findings revealed a normal upper abdomen including bowel, diaphragmatic surfaces, stomach, and omentum.  The uterus was small and mobile.   The left appeared normal.  The right ovary was large with a fully contained cyst  Bilateral tubes with clear blebs on both   ANESTHESIA:    General INTRAVENOUS FLUIDS:1700  ml ESTIMATED BLOOD LOSS:10 ml URINE OUTPUT: 700 ml  SPECIMENS: Bilateral fallopian tubes and bilateral ovaries  COMPLICATIONS: None immediate  PROCEDURE IN DETAIL: After informed consent was obtained, the patient was taken to the operating room where general anesthesia was obtained without difficulty. The patient was positioned in the  dorsal lithotomy position in [place] and her arms were carefully tucked at her sides and the usual precautions were taken. Deep Trendelenburg (20-25 deg) was established to confirm that she does not shift on the table.  She was prepped and draped in normal sterile fashion.  Time-out was performed and a Foley catheter was placed into the bladder.   After infiltration of local anesthetic at the proposed trocar sites, an 8 mm incision was created infraumbilically, and a robotic 69m port was placed under direct visualization. Pneumoperitoneum was created to a pressure of 11 mm Hg. The camera was placed and the abdomin surveyed, noting intact bowel below the site of entry. A survey of the pelvis and upper abdomen revealed the above findings. Right lateral 8-mm robotic port was placed under direct visualization and a 175mleft port similarly placed.  The patient was placed in deepTrendelenburg and the bowel was displaced up into the upper abdomen. The robot was left side docked. The instruments were placed under direct visualization.   Pelvic washings taken.  The ureters were identified bilaterally coursing outside of the operative field. Round ligaments were divided on each side with the Vessel sealer and the retroperitoneal space was opened bilaterally.  Ovariolysis was performed and the left ovary was dissected medially with care to avoid the ureter.  The infundibulopelvic ligaments were skeletonized, sealed and divided. Attention turned to the right side, and the same procedure performed.  Hemostasis was secured with suction-irrigation.The intraperitoneal pressure was dropped, and all planes of dissection, vascular pedicles  were found to be hemostatic.  The 1247mort fascia and skin incisions were extended to accommodate a 15 mm bag.  The right and left adnexa were removed individually.  The right ovary was large enough to require the excite technique.  The fascia was then closed in a running  layer, with peritoneum and rectus  muscles incorporated.  The robot was undocked. The lateral trocars were removed under visualization.  The CO2 gas was released and several deep breaths given to remove any remaining CO2 from the peritoneal cavity.  The skin incisions were closed with 4-0 Monocryl subcuticular stitch and dermabond.  The Foley catheter was removed.  The patient tolerated the procedure well and was taken to the recovery area awake and in stable condition. She received iv Toradol prior to leaving the OR.  The patient will be discharged to home as per PACU criteria. Routine postoperative instructions given.  She was prescribed Percocet, acetaminophen Ibuprofen and Colace.  She will follow up in the clinic in two weeks for postoperative evaluation.   Anesthesia was reversed without difficulty.  The patient tolerated the procedure well.  Sponge, lap and needle counts were correct x2.  The patient was taken to recovery room in excellent condition.

## 2022-12-11 NOTE — Transfer of Care (Signed)
Immediate Anesthesia Transfer of Care Note  Patient: Jacqueline Conway  Procedure(s) Performed: XI ROBOTIC ASSISTED BILATERAL SALPINGO OOPHORECTOMY, PELVIC WASHINGS (Bilateral: Abdomen) EXAM UNDER ANESTHESIA (Vagina )  Patient Location: PACU  Anesthesia Type:General  Level of Consciousness: drowsy  Airway & Oxygen Therapy: Patient Spontanous Breathing and Patient connected to face mask oxygen  Post-op Assessment: Report given to RN  Post vital signs: stable  Last Vitals:  Vitals Value Taken Time  BP    Temp    Pulse    Resp    SpO2      Last Pain:  Vitals:   12/11/22 0929  TempSrc: Temporal         Complications: No notable events documented.

## 2022-12-11 NOTE — Anesthesia Postprocedure Evaluation (Signed)
Anesthesia Post Note  Patient: Jacqueline Conway  Procedure(s) Performed: XI ROBOTIC ASSISTED BILATERAL SALPINGO OOPHORECTOMY, PELVIC WASHINGS (Bilateral: Abdomen) EXAM UNDER ANESTHESIA (Vagina )  Patient location during evaluation: PACU Anesthesia Type: General Level of consciousness: awake Pain management: pain level controlled Vital Signs Assessment: post-procedure vital signs reviewed and stable Respiratory status: spontaneous breathing and nonlabored ventilation Cardiovascular status: blood pressure returned to baseline Anesthetic complications: no  No notable events documented.   Last Vitals:  Vitals:   12/11/22 1415 12/11/22 1416  BP: 125/77 125/77  Pulse: 75 79  Resp: 19 19  Temp:    SpO2: 98% 97%    Last Pain:  Vitals:   12/11/22 1416  TempSrc:   PainSc: 0-No pain                 VAN STAVEREN,Ezrael Sam

## 2022-12-12 ENCOUNTER — Encounter: Payer: Self-pay | Admitting: Obstetrics and Gynecology

## 2022-12-15 LAB — SURGICAL PATHOLOGY

## 2022-12-16 LAB — CYTOLOGY - NON PAP

## 2023-05-23 IMAGING — MG MM DIGITAL SCREENING BILAT W/ TOMO AND CAD
8 series · 8 of 24 positions shown · non-contrast
Comparison: Previous exam(s).

CLINICAL DATA: Screening.

EXAM:
DIGITAL SCREENING BILATERAL MAMMOGRAM WITH TOMOSYNTHESIS AND CAD
TECHNIQUE: Bilateral screening digital craniocaudal and mediolateral oblique
mammograms were obtained. Bilateral screening digital breast
tomosynthesis was performed. The images were evaluated with
computer-aided detection.

[R CC synth-2D]
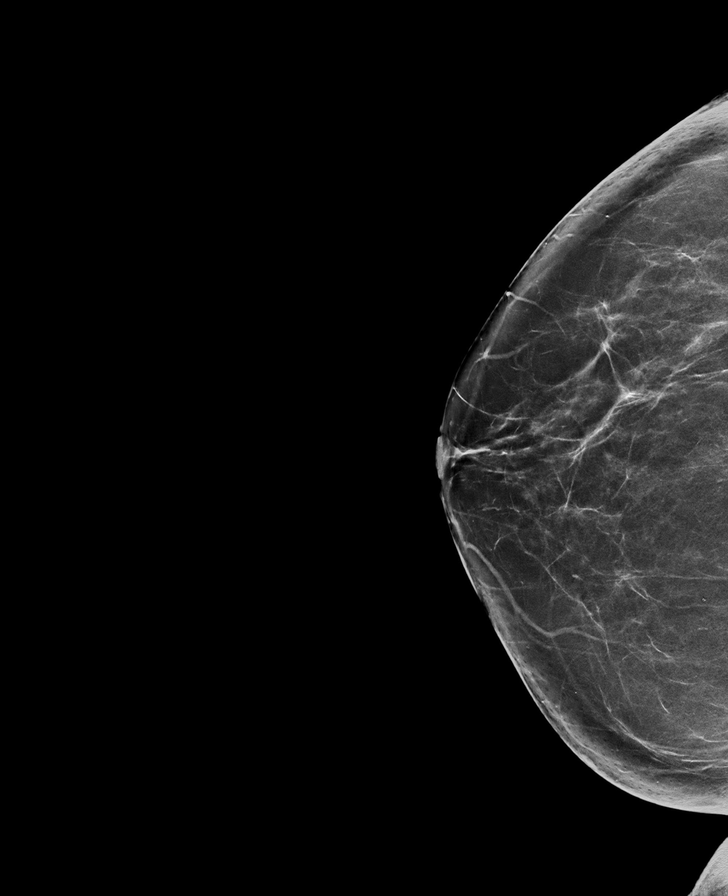

[R MLO synth-2D]
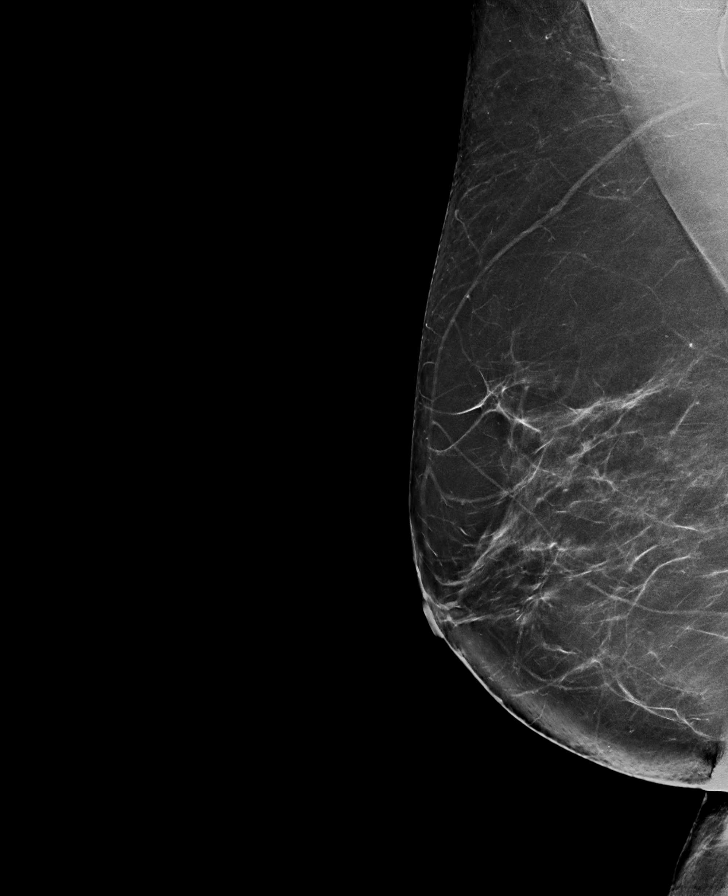

[L MLO synth-2D]
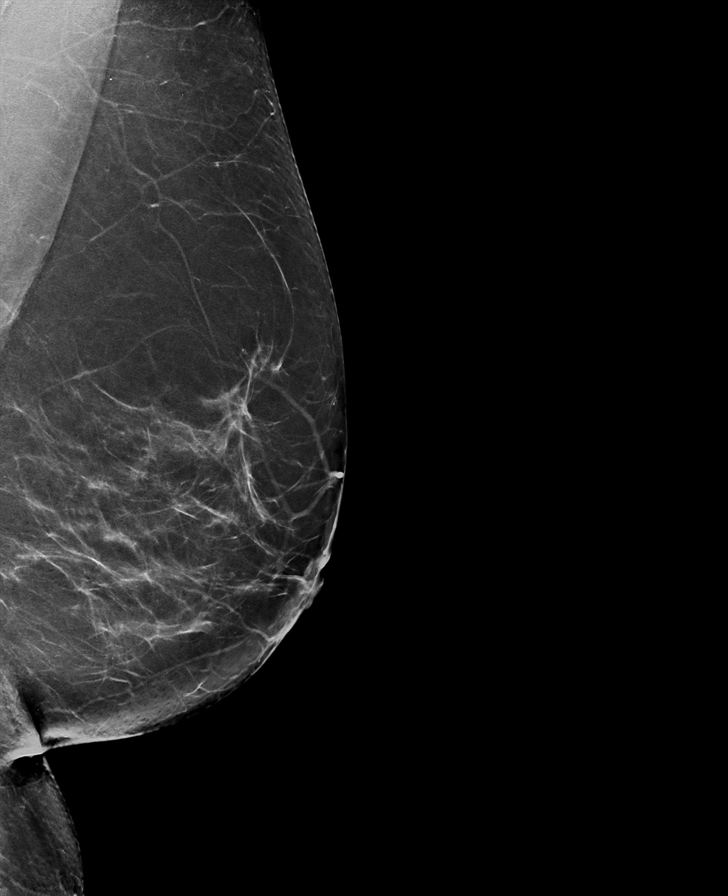

[L CC synth-2D]
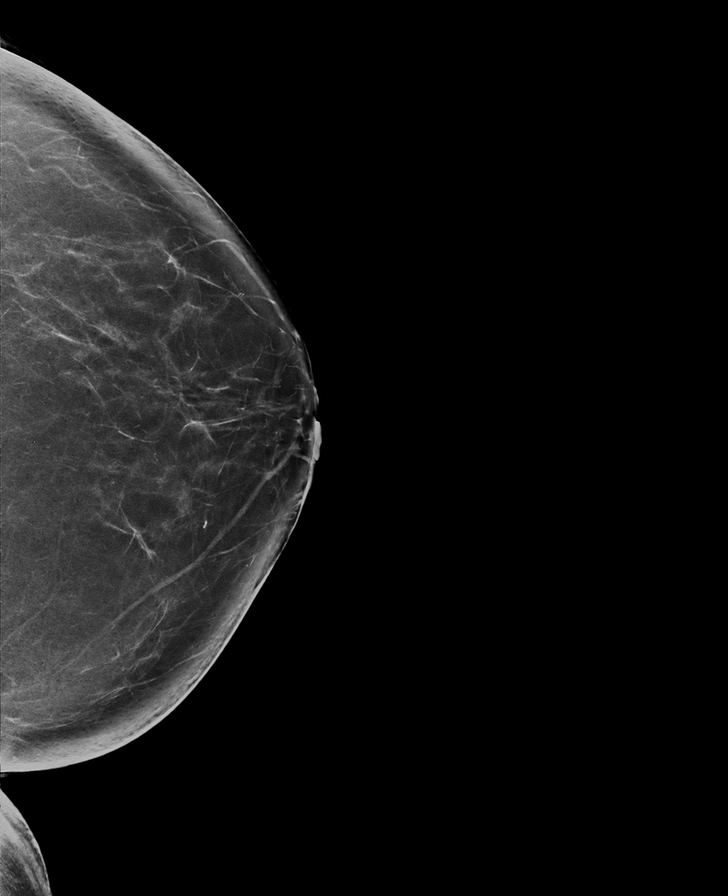

[L CC tomo · tomo slice 43/86.0]
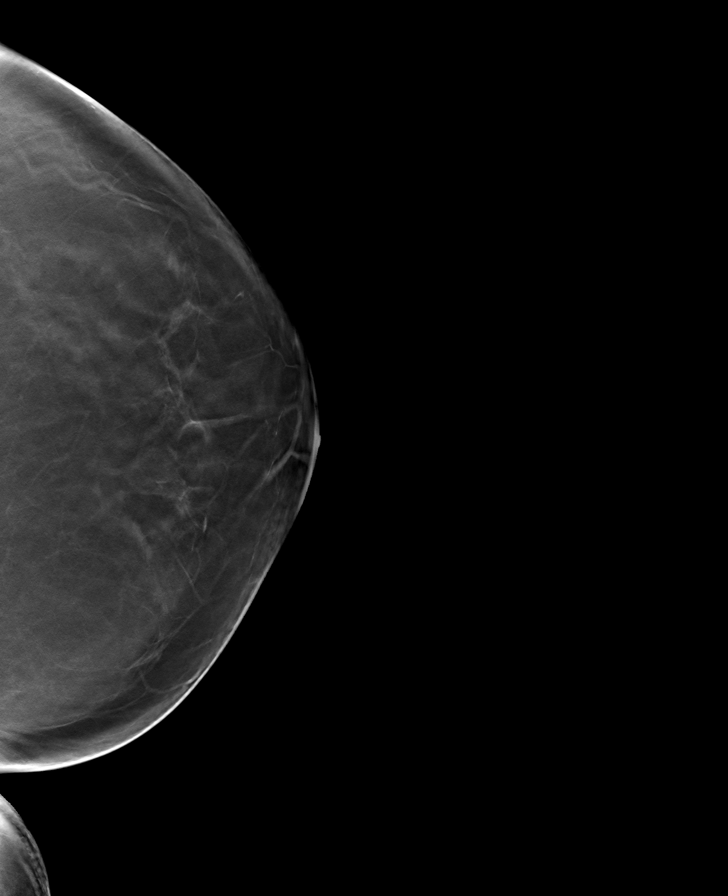

[R MLO tomo · tomo slice 42/83.0]
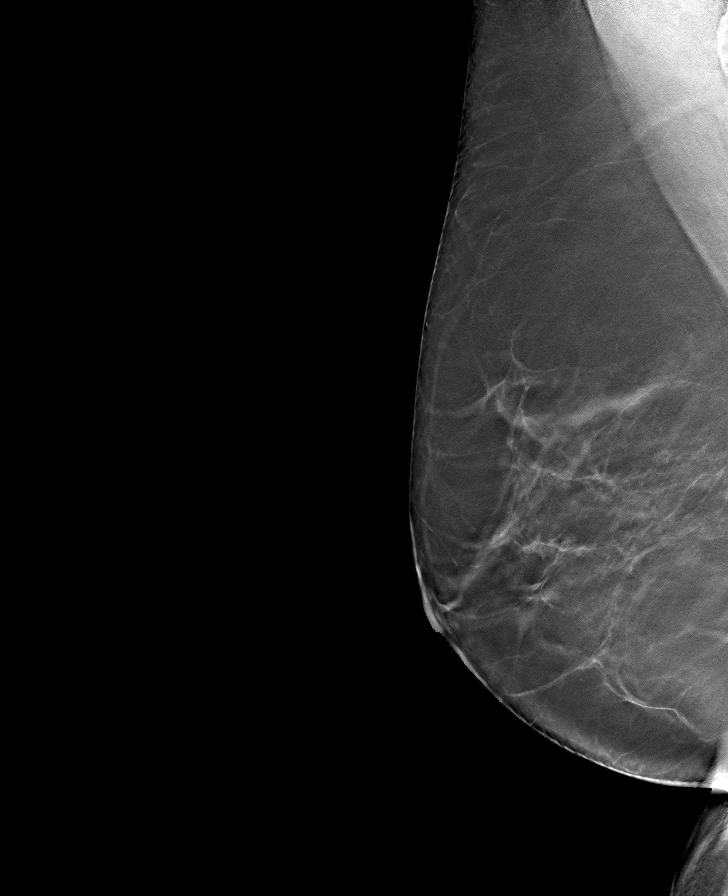

[R CC tomo · tomo slice 39/78.0]
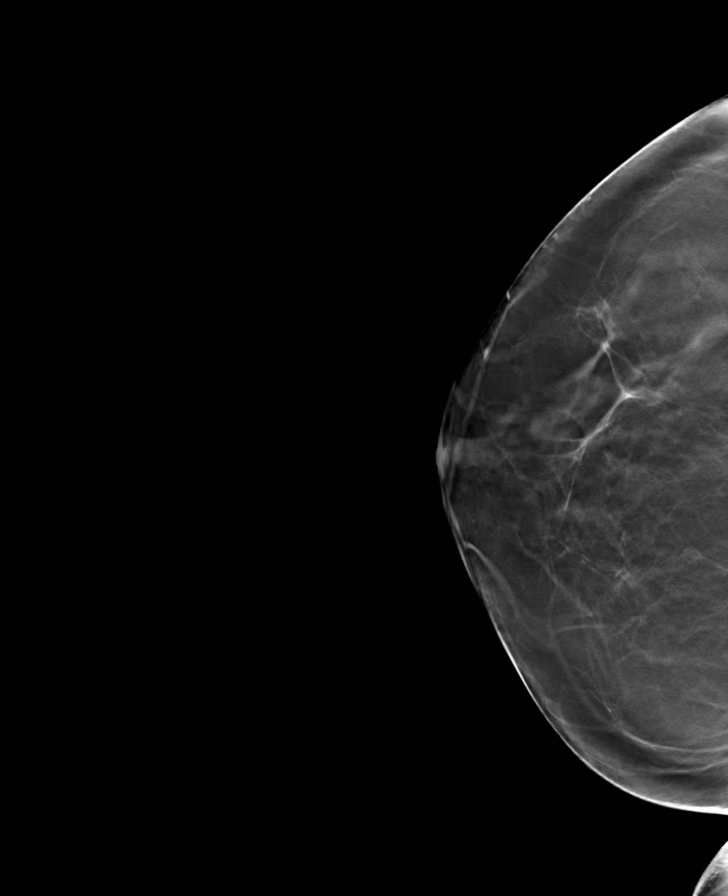

[L MLO tomo · tomo slice 41/80.0]
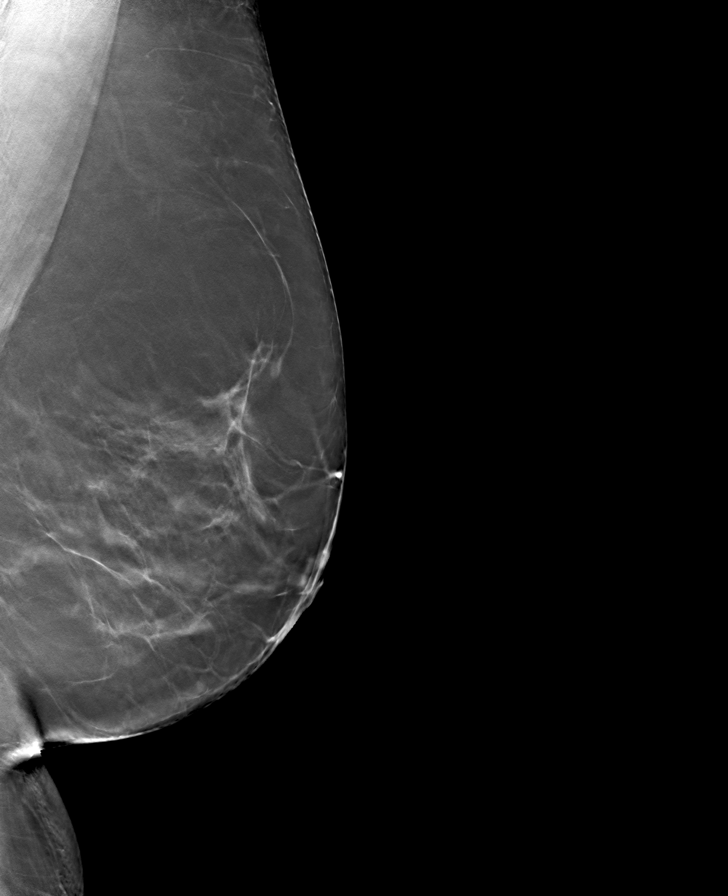

[8 of 24 positions shown; findings below may reference images not displayed]

ACR Breast Density Category b: There are scattered areas of
fibroglandular density.
FINDINGS: There are no findings suspicious for malignancy.
IMPRESSION: No mammographic evidence of malignancy. A result letter of this
screening mammogram will be mailed directly to the patient.

RECOMMENDATION:
Screening mammogram in one year. (Code:51-O-LD2)

BI-RADS CATEGORY  1: Negative.

## 2023-05-27 ENCOUNTER — Other Ambulatory Visit: Payer: Self-pay | Admitting: Internal Medicine

## 2023-05-27 DIAGNOSIS — Z1231 Encounter for screening mammogram for malignant neoplasm of breast: Secondary | ICD-10-CM

## 2024-01-12 ENCOUNTER — Ambulatory Visit
Admission: RE | Admit: 2024-01-12 | Discharge: 2024-01-12 | Disposition: A | Discharge status: Home / Self Care | Payer: Medicare HMO | Source: Ambulatory Visit | Attending: Internal Medicine | Admitting: Internal Medicine

## 2024-01-12 DIAGNOSIS — Z1231 Encounter for screening mammogram for malignant neoplasm of breast: Secondary | ICD-10-CM | POA: Diagnosis present

## 2024-04-03 ENCOUNTER — Other Ambulatory Visit: Payer: Self-pay | Admitting: Internal Medicine

## 2024-04-03 DIAGNOSIS — M79605 Pain in left leg: Secondary | ICD-10-CM

## 2024-04-13 ENCOUNTER — Ambulatory Visit
Admission: EM | Admit: 2024-04-13 | Discharge: 2024-04-13 | Disposition: A | Discharge status: Home / Self Care | Attending: Family Medicine | Admitting: Family Medicine

## 2024-04-13 DIAGNOSIS — H66002 Acute suppurative otitis media without spontaneous rupture of ear drum, left ear: Secondary | ICD-10-CM | POA: Diagnosis not present

## 2024-04-13 DIAGNOSIS — H6123 Impacted cerumen, bilateral: Secondary | ICD-10-CM

## 2024-04-13 MED ORDER — AZITHROMYCIN 250 MG PO TABS: 250.0000 mg | ORAL_TABLET | Freq: Every day | ORAL | 0 refills | Status: AC

## 2024-04-13 NOTE — ED Triage Notes (Signed)
 Pt is with her husband  Pt states that she had a virus last week and now feels like she has a bilateral ear infection.  Pt c/o pressure in both ears, congestion, dizziness, x5days

## 2024-04-13 NOTE — Discharge Instructions (Addendum)
 Zithromax  as prescribed for your ear infection.  You may continue over-the-counter Benadryl, Tylenol , or ibuprofen  as needed.  Lots of rest and fluids.  Please follow-up with your PCP if your symptoms do not improve.  Please go to the ER for any worsening symptoms.  Hope you feel better soon!

## 2024-04-13 NOTE — ED Provider Notes (Addendum)
 [facility]    CSN: [id number] Arrival date & time: 04/13/24  0818      History   Chief Complaint Chief Complaint  Patient presents with   Ear Problem    HPI Jacqueline Conway is a 68 y.o. female for bilateral ear fullness.  Patient reports last week she had a respiratory virus that has significantly improved.  Still has some residual congestion but no fevers, cough, sore throat, body aches, shortness of breath.  Reports 5 days of bilateral ear fullness without pain as well as some intermittent vertigo.  Denies any ear drainage but does endorse muffled hearing.  No asthma or smoking history.  Has been taking Benadryl OTC for symptoms.  No other concerns at this time.  HPI  Past Medical History:  Diagnosis Date   Adnexal mass    Anxiety    Arthritis    back- DDD   Cancer ([facility])    basal cell skin ca   Complex cyst of right ovary    Depression    Essential tremor    Headache    migrains   Hip joint pain    Hyperlipidemia    Hypothyroidism    Lyme disease    Osteoporosis    Psychological trauma     There are no active problems to display for this patient.   Past Surgical History:  Procedure Laterality Date   CARDIAC CATHETERIZATION     COLONOSCOPY     EYE SURGERY     bilateral cataract   ROBOTIC ASSISTED BILATERAL SALPINGO OOPHERECTOMY Bilateral 12/11/2022   Procedure: XI ROBOTIC ASSISTED BILATERAL SALPINGO OOPHORECTOMY, PELVIC WASHINGS;  Surgeon: Prescilla Brod, MD;  Location: [facility] ORS;  Service: Gynecology;  Laterality: Bilateral;    OB History   No obstetric history on file.      Home Medications    Prior to Admission medications   Medication Sig Start Date End Date Taking? Authorizing Provider  azithromycin  (ZITHROMAX ) 250 MG tablet Take 1 tablet (250 mg total) by mouth daily. Take first 2 tablets together, then 1 every day until finished. 04/13/24  Yes Leathie Weich, Jodi R, NP  b complex vitamins capsule Take 1 capsule by mouth as needed.   Yes  [provider]  Cholecalciferol (VITAMIN D3) 50 MCG (2000 UT) capsule Take 2,000 Units by mouth daily.   Yes [provider]  levothyroxine (SYNTHROID) 75 MCG tablet Take 75 mcg by mouth daily before breakfast.   Yes [provider]  liothyronine (CYTOMEL) 25 MCG tablet Take 25 mcg by mouth daily. Take 1/2 tablet   Yes [provider]  Magnesium Citrate POWD by Does not apply route daily. Take 1/4 teaspoon qhs   Yes [provider]  Menaquinone-7 (VITAMIN K2 PO) Take by mouth as needed.   Yes [provider]  Milk Thistle 175 MG CAPS Take by mouth daily.   Yes [provider]  OVER THE COUNTER MEDICATION as needed. ASHWAGANDHA ROOT EXTRACT   Yes [provider]  OVER THE COUNTER MEDICATION 1 tablet daily. Magnesium Threonate   Yes [provider]  selenium 50 MCG TABS tablet Take 50 mcg by mouth daily. 200 mcg   Yes [provider]  docusate sodium  (COLACE) 100 MG capsule Take 1 capsule (100 mg total) by mouth 2 (two) times daily. To keep stools soft 12/11/22   Prescilla Brod, MD  gabapentin  (NEURONTIN ) 300 MG capsule Take 1 capsule (300 mg total) by mouth at bedtime for 14 days. 12/11/22 12/25/22  Prescilla Brod, MD  Multiple Vitamins-Minerals (ZINC PO) Take by mouth.    [provider]  oxyCODONE  (OXY IR/ROXICODONE ) 5 MG immediate release tablet Take 1 tablet (5 mg total) by mouth every 4 (four) hours as needed for severe pain. 12/11/22   Prescilla Brod, MD    Family History Family History  Problem Relation Age of Onset   Breast cancer Neg Hx     Social History Social History   Tobacco Use   Smoking status: Never   Smokeless tobacco: Never  Vaping Use   Vaping status: Never Used  Substance Use Topics   Alcohol use: Yes    Alcohol/week: 1.0 standard drink of alcohol    Types: 1 Glasses of wine per week    Comment: weekly   Drug use: Never     Allergies   Ampicillin,  Penicillins, Rifabutin, and Rifamycins   Review of Systems Review of Systems  HENT:  Positive for congestion.        Bilateral ear fullness     Physical Exam Triage Vital Signs ED Triage Vitals  Encounter Vitals Group     BP 04/13/24 0828 (!) 166/102     Systolic BP Percentile --      Diastolic BP Percentile --      Pulse Rate 04/13/24 0828 (!) 107     Resp --      Temp 04/13/24 0828 97.6 F (36.4 C)     Temp Source 04/13/24 0828 Oral     SpO2 04/13/24 0828 94 %     Weight 04/13/24 0830 164 lb (74.4 kg)     Height 04/13/24 0830 5\' 6"  (1.676 m)     Head Circumference --      Peak Flow --      Pain Score 04/13/24 0830 5     Pain Loc --      Pain Education --      Exclude from Growth Chart --    No data found.  Updated Vital Signs BP (!) 166/102 (BP Location: Left Arm)   Pulse (!) 107   Temp 97.6 F (36.4 C) (Oral)   Ht 5\' 6"  (1.676 m)   Wt 164 lb (74.4 kg)   SpO2 94%   BMI 26.47 kg/m   Visual Acuity Right Eye Distance:   Left Eye Distance:   Bilateral Distance:    Right Eye Near:   Left Eye Near:    Bilateral Near:     Physical Exam Vitals and nursing note reviewed.  Constitutional:      General: She is not in acute distress.    Appearance: Normal appearance. She is not ill-appearing.  HENT:     Head: Normocephalic and atraumatic.     Right Ear: There is impacted cerumen.     Left Ear: There is impacted cerumen.     Ears:     Comments: After irrigation left TM with erythema. Right TM and canal WNL.  Eyes:     Pupils: Pupils are equal, round, and reactive to light.  Cardiovascular:     Rate and Rhythm: Normal rate and regular rhythm.     Heart sounds: Normal heart sounds.  Pulmonary:     Effort: Pulmonary effort is normal.     Breath sounds: Normal breath sounds.  Skin:    General: Skin is warm and dry.  Neurological:     General: No focal deficit present.     Mental Status: She is alert and oriented to person, place,  and time.  Psychiatric:         Mood and Affect: Mood normal.        Behavior: Behavior normal.      UC Treatments / Results  Labs (all labs ordered are listed, but only abnormal results are displayed) Labs Reviewed - No data to display  EKG   Radiology No results found.  Procedures Procedures (including critical care time)  Medications Ordered in UC Medications - No data to display  Initial Impression / Assessment and Plan / UC Course  I have reviewed the triage vital signs and the nursing notes.  Pertinent labs & imaging results that were available during my care of the patient were reviewed by me and considered in my medical decision making (see chart for details).     Reviewed exam and symptoms with patient.  No red flags.  Patient tolerated irrigation well.  Will start azithromycin  for left OM as patient has penicillin allergy.  Continue OTC analgesics as well as Benadryl as needed. BP elevated, pt has white coat syndrome.  Discussed rest fluids and PCP follow-up if symptoms do not improve.  ER precautions reviewed and patient verbalized understanding. Final Clinical Impressions(s) / UC Diagnoses   Final diagnoses:  Bilateral impacted cerumen  Non-recurrent acute suppurative otitis media of left ear without spontaneous rupture of tympanic membrane     Discharge Instructions      Zithromax  as prescribed for your ear infection.  You may continue over-the-counter Benadryl, Tylenol , or ibuprofen  as needed.  Lots of rest and fluids.  Please follow-up with your PCP if your symptoms do not improve.  Please go to the ER for any worsening symptoms.  Hope you feel better soon!     ED Prescriptions     Medication Sig Dispense Auth. Provider   azithromycin  (ZITHROMAX ) 250 MG tablet Take 1 tablet (250 mg total) by mouth daily. Take first 2 tablets together, then 1 every day until finished. 6 tablet Rease Swinson, Jodi R, NP      PDMP not reviewed this encounter.   Alleen Arbour, NP 04/13/24 1610     Alleen Arbour, NP 04/13/24 [phone]

## 2024-05-31 ENCOUNTER — Other Ambulatory Visit: Payer: Self-pay | Admitting: Internal Medicine

## 2024-05-31 DIAGNOSIS — R1032 Left lower quadrant pain: Secondary | ICD-10-CM

## 2024-05-31 DIAGNOSIS — Z0000 Encounter for general adult medical examination without abnormal findings: Secondary | ICD-10-CM

## 2024-06-07 ENCOUNTER — Ambulatory Visit
Admission: RE | Admit: 2024-06-07 | Discharge: 2024-06-07 | Disposition: A | Discharge status: Home / Self Care | Source: Ambulatory Visit | Attending: Internal Medicine | Admitting: Internal Medicine

## 2024-06-07 DIAGNOSIS — Z0000 Encounter for general adult medical examination without abnormal findings: Secondary | ICD-10-CM | POA: Insufficient documentation

## 2024-06-07 DIAGNOSIS — R1031 Right lower quadrant pain: Secondary | ICD-10-CM | POA: Diagnosis present

## 2024-06-07 DIAGNOSIS — R1032 Left lower quadrant pain: Secondary | ICD-10-CM | POA: Insufficient documentation

## 2024-06-07 MED ORDER — IOHEXOL 300 MG/ML  SOLN
100.0000 mL | Freq: Once | INTRAMUSCULAR | Status: AC | PRN
  Administered 2024-06-07: 100 mL via INTRAVENOUS
